# Patient Record
Sex: Male | Born: 1950 | Race: White | Hispanic: No | Marital: Married | Smoking: Never smoker
Health system: Southern US, Community
[De-identification: ages and names within clinical notes are randomized; demographics above are authoritative.]

## PROBLEM LIST (undated history)

## (undated) DIAGNOSIS — E039 Hypothyroidism, unspecified: Secondary | ICD-10-CM

## (undated) DIAGNOSIS — I10 Essential (primary) hypertension: Secondary | ICD-10-CM

## (undated) HISTORY — PX: FRACTURE SURGERY: SHX138

## (undated) HISTORY — PX: SPLENECTOMY: SUR1306

## (undated) HISTORY — PX: CHOLECYSTECTOMY: SHX55

---

## 2005-12-24 ENCOUNTER — Ambulatory Visit (HOSPITAL_COMMUNITY): Admission: RE | Admit: 2005-12-24 | Discharge: 2005-12-24 | Payer: Self-pay | Admitting: Thoracic Surgery

## 2007-03-24 ENCOUNTER — Ambulatory Visit (HOSPITAL_COMMUNITY): Admission: RE | Admit: 2007-03-24 | Discharge: 2007-03-24 | Payer: Self-pay | Admitting: Internal Medicine

## 2010-04-01 DIAGNOSIS — R55 Syncope and collapse: Secondary | ICD-10-CM

## 2010-04-03 DIAGNOSIS — R55 Syncope and collapse: Secondary | ICD-10-CM

## 2010-07-30 ENCOUNTER — Ambulatory Visit (INDEPENDENT_AMBULATORY_CARE_PROVIDER_SITE_OTHER): Payer: PRIVATE HEALTH INSURANCE | Admitting: Internal Medicine

## 2010-10-19 ENCOUNTER — Telehealth (INDEPENDENT_AMBULATORY_CARE_PROVIDER_SITE_OTHER): Payer: Self-pay | Admitting: *Deleted

## 2010-10-19 DIAGNOSIS — R634 Abnormal weight loss: Secondary | ICD-10-CM

## 2010-10-19 NOTE — Telephone Encounter (Signed)
Lads due

## 2019-01-20 DIAGNOSIS — S82142D Displaced bicondylar fracture of left tibia, subsequent encounter for closed fracture with routine healing: Secondary | ICD-10-CM | POA: Diagnosis not present

## 2019-01-20 DIAGNOSIS — S42022D Displaced fracture of shaft of left clavicle, subsequent encounter for fracture with routine healing: Secondary | ICD-10-CM | POA: Diagnosis not present

## 2019-01-20 DIAGNOSIS — R262 Difficulty in walking, not elsewhere classified: Secondary | ICD-10-CM | POA: Diagnosis not present

## 2019-01-20 DIAGNOSIS — M6281 Muscle weakness (generalized): Secondary | ICD-10-CM | POA: Diagnosis not present

## 2019-01-27 DIAGNOSIS — M6281 Muscle weakness (generalized): Secondary | ICD-10-CM | POA: Diagnosis not present

## 2019-01-27 DIAGNOSIS — S42022D Displaced fracture of shaft of left clavicle, subsequent encounter for fracture with routine healing: Secondary | ICD-10-CM | POA: Diagnosis not present

## 2019-01-27 DIAGNOSIS — S82142D Displaced bicondylar fracture of left tibia, subsequent encounter for closed fracture with routine healing: Secondary | ICD-10-CM | POA: Diagnosis not present

## 2019-01-27 DIAGNOSIS — R262 Difficulty in walking, not elsewhere classified: Secondary | ICD-10-CM | POA: Diagnosis not present

## 2019-02-03 DIAGNOSIS — M6281 Muscle weakness (generalized): Secondary | ICD-10-CM | POA: Diagnosis not present

## 2019-02-03 DIAGNOSIS — R262 Difficulty in walking, not elsewhere classified: Secondary | ICD-10-CM | POA: Diagnosis not present

## 2019-02-03 DIAGNOSIS — S82142D Displaced bicondylar fracture of left tibia, subsequent encounter for closed fracture with routine healing: Secondary | ICD-10-CM | POA: Diagnosis not present

## 2019-02-03 DIAGNOSIS — S42022D Displaced fracture of shaft of left clavicle, subsequent encounter for fracture with routine healing: Secondary | ICD-10-CM | POA: Diagnosis not present

## 2019-02-10 DIAGNOSIS — S82142D Displaced bicondylar fracture of left tibia, subsequent encounter for closed fracture with routine healing: Secondary | ICD-10-CM | POA: Diagnosis not present

## 2019-02-10 DIAGNOSIS — S42022D Displaced fracture of shaft of left clavicle, subsequent encounter for fracture with routine healing: Secondary | ICD-10-CM | POA: Diagnosis not present

## 2019-02-10 DIAGNOSIS — R262 Difficulty in walking, not elsewhere classified: Secondary | ICD-10-CM | POA: Diagnosis not present

## 2019-02-10 DIAGNOSIS — M6281 Muscle weakness (generalized): Secondary | ICD-10-CM | POA: Diagnosis not present

## 2019-02-12 DIAGNOSIS — I10 Essential (primary) hypertension: Secondary | ICD-10-CM | POA: Diagnosis not present

## 2019-02-12 DIAGNOSIS — R911 Solitary pulmonary nodule: Secondary | ICD-10-CM | POA: Diagnosis not present

## 2019-02-12 DIAGNOSIS — Z299 Encounter for prophylactic measures, unspecified: Secondary | ICD-10-CM | POA: Diagnosis not present

## 2019-02-12 DIAGNOSIS — M25562 Pain in left knee: Secondary | ICD-10-CM | POA: Diagnosis not present

## 2019-02-12 DIAGNOSIS — D692 Other nonthrombocytopenic purpura: Secondary | ICD-10-CM | POA: Diagnosis not present

## 2019-02-17 DIAGNOSIS — R262 Difficulty in walking, not elsewhere classified: Secondary | ICD-10-CM | POA: Diagnosis not present

## 2019-02-17 DIAGNOSIS — S42142D Displaced fracture of glenoid cavity of scapula, left shoulder, subsequent encounter for fracture with routine healing: Secondary | ICD-10-CM | POA: Diagnosis not present

## 2019-02-17 DIAGNOSIS — S42022D Displaced fracture of shaft of left clavicle, subsequent encounter for fracture with routine healing: Secondary | ICD-10-CM | POA: Diagnosis not present

## 2019-02-17 DIAGNOSIS — M6281 Muscle weakness (generalized): Secondary | ICD-10-CM | POA: Diagnosis not present

## 2019-03-24 DIAGNOSIS — R918 Other nonspecific abnormal finding of lung field: Secondary | ICD-10-CM | POA: Diagnosis not present

## 2019-03-24 DIAGNOSIS — R911 Solitary pulmonary nodule: Secondary | ICD-10-CM | POA: Diagnosis not present

## 2019-03-26 DIAGNOSIS — Z299 Encounter for prophylactic measures, unspecified: Secondary | ICD-10-CM | POA: Diagnosis not present

## 2019-03-26 DIAGNOSIS — D1800 Hemangioma unspecified site: Secondary | ICD-10-CM | POA: Diagnosis not present

## 2019-03-26 DIAGNOSIS — I700 Atherosclerosis of aorta: Secondary | ICD-10-CM | POA: Diagnosis not present

## 2019-03-26 DIAGNOSIS — R911 Solitary pulmonary nodule: Secondary | ICD-10-CM | POA: Diagnosis not present

## 2019-03-26 DIAGNOSIS — M545 Low back pain: Secondary | ICD-10-CM | POA: Diagnosis not present

## 2019-03-26 DIAGNOSIS — I10 Essential (primary) hypertension: Secondary | ICD-10-CM | POA: Diagnosis not present

## 2019-04-13 DIAGNOSIS — Z1211 Encounter for screening for malignant neoplasm of colon: Secondary | ICD-10-CM | POA: Diagnosis not present

## 2019-04-13 DIAGNOSIS — Z7189 Other specified counseling: Secondary | ICD-10-CM | POA: Diagnosis not present

## 2019-04-13 DIAGNOSIS — I10 Essential (primary) hypertension: Secondary | ICD-10-CM | POA: Diagnosis not present

## 2019-04-13 DIAGNOSIS — Z79899 Other long term (current) drug therapy: Secondary | ICD-10-CM | POA: Diagnosis not present

## 2019-04-13 DIAGNOSIS — R5383 Other fatigue: Secondary | ICD-10-CM | POA: Diagnosis not present

## 2019-04-13 DIAGNOSIS — Z0000 Encounter for general adult medical examination without abnormal findings: Secondary | ICD-10-CM | POA: Diagnosis not present

## 2019-04-13 DIAGNOSIS — E039 Hypothyroidism, unspecified: Secondary | ICD-10-CM | POA: Diagnosis not present

## 2019-04-13 DIAGNOSIS — E7800 Pure hypercholesterolemia, unspecified: Secondary | ICD-10-CM | POA: Diagnosis not present

## 2019-04-13 DIAGNOSIS — Z299 Encounter for prophylactic measures, unspecified: Secondary | ICD-10-CM | POA: Diagnosis not present

## 2019-05-22 DIAGNOSIS — M1990 Unspecified osteoarthritis, unspecified site: Secondary | ICD-10-CM | POA: Diagnosis not present

## 2019-05-22 DIAGNOSIS — Z20822 Contact with and (suspected) exposure to covid-19: Secondary | ICD-10-CM | POA: Diagnosis not present

## 2019-05-22 DIAGNOSIS — I10 Essential (primary) hypertension: Secondary | ICD-10-CM | POA: Diagnosis not present

## 2019-05-22 DIAGNOSIS — K219 Gastro-esophageal reflux disease without esophagitis: Secondary | ICD-10-CM | POA: Diagnosis not present

## 2019-05-22 DIAGNOSIS — Z888 Allergy status to other drugs, medicaments and biological substances status: Secondary | ICD-10-CM | POA: Diagnosis not present

## 2019-05-22 DIAGNOSIS — Z79899 Other long term (current) drug therapy: Secondary | ICD-10-CM | POA: Diagnosis not present

## 2019-05-22 DIAGNOSIS — R42 Dizziness and giddiness: Secondary | ICD-10-CM | POA: Diagnosis not present

## 2019-05-22 DIAGNOSIS — E039 Hypothyroidism, unspecified: Secondary | ICD-10-CM | POA: Diagnosis not present

## 2019-05-22 DIAGNOSIS — D72829 Elevated white blood cell count, unspecified: Secondary | ICD-10-CM | POA: Diagnosis not present

## 2019-05-24 DIAGNOSIS — Z299 Encounter for prophylactic measures, unspecified: Secondary | ICD-10-CM | POA: Diagnosis not present

## 2019-05-24 DIAGNOSIS — I10 Essential (primary) hypertension: Secondary | ICD-10-CM | POA: Diagnosis not present

## 2019-05-24 DIAGNOSIS — I700 Atherosclerosis of aorta: Secondary | ICD-10-CM | POA: Diagnosis not present

## 2019-05-31 DIAGNOSIS — I700 Atherosclerosis of aorta: Secondary | ICD-10-CM | POA: Diagnosis not present

## 2019-05-31 DIAGNOSIS — I10 Essential (primary) hypertension: Secondary | ICD-10-CM | POA: Diagnosis not present

## 2019-05-31 DIAGNOSIS — E039 Hypothyroidism, unspecified: Secondary | ICD-10-CM | POA: Diagnosis not present

## 2019-05-31 DIAGNOSIS — Z299 Encounter for prophylactic measures, unspecified: Secondary | ICD-10-CM | POA: Diagnosis not present

## 2019-06-30 DIAGNOSIS — I700 Atherosclerosis of aorta: Secondary | ICD-10-CM | POA: Diagnosis not present

## 2019-06-30 DIAGNOSIS — I10 Essential (primary) hypertension: Secondary | ICD-10-CM | POA: Diagnosis not present

## 2019-06-30 DIAGNOSIS — Z299 Encounter for prophylactic measures, unspecified: Secondary | ICD-10-CM | POA: Diagnosis not present

## 2019-06-30 DIAGNOSIS — E559 Vitamin D deficiency, unspecified: Secondary | ICD-10-CM | POA: Diagnosis not present

## 2019-06-30 DIAGNOSIS — R5383 Other fatigue: Secondary | ICD-10-CM | POA: Diagnosis not present

## 2019-07-01 DIAGNOSIS — R5383 Other fatigue: Secondary | ICD-10-CM | POA: Diagnosis not present

## 2019-07-01 DIAGNOSIS — E559 Vitamin D deficiency, unspecified: Secondary | ICD-10-CM | POA: Diagnosis not present

## 2019-07-30 ENCOUNTER — Inpatient Hospital Stay (HOSPITAL_COMMUNITY)
Admission: EM | Admit: 2019-07-30 | Discharge: 2019-08-06 | DRG: 184 | Disposition: A | Discharge status: Home Health | Payer: Medicare Other | Attending: General Surgery | Admitting: General Surgery

## 2019-07-30 ENCOUNTER — Emergency Department (HOSPITAL_COMMUNITY): Payer: Medicare Other

## 2019-07-30 DIAGNOSIS — E785 Hyperlipidemia, unspecified: Secondary | ICD-10-CM | POA: Diagnosis present

## 2019-07-30 DIAGNOSIS — D1803 Hemangioma of intra-abdominal structures: Secondary | ICD-10-CM | POA: Diagnosis present

## 2019-07-30 DIAGNOSIS — Z20822 Contact with and (suspected) exposure to covid-19: Secondary | ICD-10-CM | POA: Diagnosis present

## 2019-07-30 DIAGNOSIS — Z23 Encounter for immunization: Secondary | ICD-10-CM

## 2019-07-30 DIAGNOSIS — S00512A Abrasion of oral cavity, initial encounter: Secondary | ICD-10-CM | POA: Diagnosis present

## 2019-07-30 DIAGNOSIS — Z743 Need for continuous supervision: Secondary | ICD-10-CM | POA: Diagnosis not present

## 2019-07-30 DIAGNOSIS — S22069A Unspecified fracture of T7-T8 vertebra, initial encounter for closed fracture: Secondary | ICD-10-CM | POA: Diagnosis not present

## 2019-07-30 DIAGNOSIS — S60811A Abrasion of right wrist, initial encounter: Secondary | ICD-10-CM | POA: Diagnosis not present

## 2019-07-30 DIAGNOSIS — R21 Rash and other nonspecific skin eruption: Secondary | ICD-10-CM | POA: Diagnosis not present

## 2019-07-30 DIAGNOSIS — R52 Pain, unspecified: Secondary | ICD-10-CM

## 2019-07-30 DIAGNOSIS — S3991XA Unspecified injury of abdomen, initial encounter: Secondary | ICD-10-CM | POA: Diagnosis not present

## 2019-07-30 DIAGNOSIS — M4854XA Collapsed vertebra, not elsewhere classified, thoracic region, initial encounter for fracture: Secondary | ICD-10-CM | POA: Diagnosis present

## 2019-07-30 DIAGNOSIS — Z79891 Long term (current) use of opiate analgesic: Secondary | ICD-10-CM | POA: Diagnosis not present

## 2019-07-30 DIAGNOSIS — I2510 Atherosclerotic heart disease of native coronary artery without angina pectoris: Secondary | ICD-10-CM | POA: Diagnosis not present

## 2019-07-30 DIAGNOSIS — J9811 Atelectasis: Secondary | ICD-10-CM | POA: Diagnosis not present

## 2019-07-30 DIAGNOSIS — S2243XA Multiple fractures of ribs, bilateral, initial encounter for closed fracture: Principal | ICD-10-CM | POA: Diagnosis present

## 2019-07-30 DIAGNOSIS — S3993XA Unspecified injury of pelvis, initial encounter: Secondary | ICD-10-CM | POA: Diagnosis not present

## 2019-07-30 DIAGNOSIS — S0990XA Unspecified injury of head, initial encounter: Secondary | ICD-10-CM | POA: Diagnosis not present

## 2019-07-30 DIAGNOSIS — Z888 Allergy status to other drugs, medicaments and biological substances status: Secondary | ICD-10-CM | POA: Diagnosis not present

## 2019-07-30 DIAGNOSIS — R0602 Shortness of breath: Secondary | ICD-10-CM

## 2019-07-30 DIAGNOSIS — I119 Hypertensive heart disease without heart failure: Secondary | ICD-10-CM | POA: Diagnosis present

## 2019-07-30 DIAGNOSIS — M25519 Pain in unspecified shoulder: Secondary | ICD-10-CM | POA: Diagnosis not present

## 2019-07-30 DIAGNOSIS — R0989 Other specified symptoms and signs involving the circulatory and respiratory systems: Secondary | ICD-10-CM

## 2019-07-30 DIAGNOSIS — R404 Transient alteration of awareness: Secondary | ICD-10-CM | POA: Diagnosis not present

## 2019-07-30 DIAGNOSIS — Z79899 Other long term (current) drug therapy: Secondary | ICD-10-CM

## 2019-07-30 DIAGNOSIS — S199XXA Unspecified injury of neck, initial encounter: Secondary | ICD-10-CM | POA: Diagnosis not present

## 2019-07-30 DIAGNOSIS — S52131A Displaced fracture of neck of right radius, initial encounter for closed fracture: Secondary | ICD-10-CM | POA: Diagnosis present

## 2019-07-30 DIAGNOSIS — Z9049 Acquired absence of other specified parts of digestive tract: Secondary | ICD-10-CM

## 2019-07-30 DIAGNOSIS — I517 Cardiomegaly: Secondary | ICD-10-CM | POA: Diagnosis not present

## 2019-07-30 DIAGNOSIS — Z9081 Acquired absence of spleen: Secondary | ICD-10-CM

## 2019-07-30 DIAGNOSIS — R0902 Hypoxemia: Secondary | ICD-10-CM | POA: Diagnosis not present

## 2019-07-30 DIAGNOSIS — T07XXXA Unspecified multiple injuries, initial encounter: Secondary | ICD-10-CM | POA: Diagnosis not present

## 2019-07-30 DIAGNOSIS — S2239XA Fracture of one rib, unspecified side, initial encounter for closed fracture: Secondary | ICD-10-CM

## 2019-07-30 DIAGNOSIS — S2249XA Multiple fractures of ribs, unspecified side, initial encounter for closed fracture: Secondary | ICD-10-CM

## 2019-07-30 DIAGNOSIS — S27321A Contusion of lung, unilateral, initial encounter: Secondary | ICD-10-CM | POA: Diagnosis present

## 2019-07-30 DIAGNOSIS — T1490XA Injury, unspecified, initial encounter: Secondary | ICD-10-CM

## 2019-07-30 DIAGNOSIS — E039 Hypothyroidism, unspecified: Secondary | ICD-10-CM | POA: Diagnosis not present

## 2019-07-30 DIAGNOSIS — Z79890 Hormone replacement therapy (postmenopausal): Secondary | ICD-10-CM

## 2019-07-30 DIAGNOSIS — S2242XA Multiple fractures of ribs, left side, initial encounter for closed fracture: Secondary | ICD-10-CM | POA: Diagnosis not present

## 2019-07-30 HISTORY — DX: Hypothyroidism, unspecified: E03.9

## 2019-07-30 HISTORY — DX: Essential (primary) hypertension: I10

## 2019-07-30 LAB — COMPREHENSIVE METABOLIC PANEL
ALT: 78 U/L — ABNORMAL HIGH (ref 0–44)
AST: 118 U/L — ABNORMAL HIGH (ref 15–41)
Albumin: 3.7 g/dL (ref 3.5–5.0)
Alkaline Phosphatase: 67 U/L (ref 38–126)
Anion gap: 14 (ref 5–15)
BUN: 13 mg/dL (ref 8–23)
CO2: 22 mmol/L (ref 22–32)
Calcium: 9 mg/dL (ref 8.9–10.3)
Chloride: 102 mmol/L (ref 98–111)
Creatinine, Ser: 1.05 mg/dL (ref 0.61–1.24)
GFR calc Af Amer: >60 mL/min (ref >60)
GFR calc non Af Amer: >60 mL/min (ref >60)
Glucose, Bld: 120 mg/dL — ABNORMAL HIGH (ref 70–99)
Potassium: 3.4 mmol/L — ABNORMAL LOW (ref 3.5–5.1)
Sodium: 138 mmol/L (ref 135–145)
Total Bilirubin: 0.5 mg/dL (ref 0.3–1.2)
Total Protein: 6.2 g/dL — ABNORMAL LOW (ref 6.5–8.1)

## 2019-07-30 LAB — I-STAT CHEM 8, ED
BUN: 15 mg/dL (ref 8–23)
Calcium, Ion: 1.18 mmol/L (ref 1.15–1.40)
Chloride: 101 mmol/L (ref 98–111)
Creatinine, Ser: 1.4 mg/dL — ABNORMAL HIGH (ref 0.61–1.24)
Glucose, Bld: 117 mg/dL — ABNORMAL HIGH (ref 70–99)
HCT: 42 % (ref 39.0–52.0)
Hemoglobin: 14.3 g/dL (ref 13.0–17.0)
Potassium: 3.4 mmol/L — ABNORMAL LOW (ref 3.5–5.1)
Sodium: 140 mmol/L (ref 135–145)
TCO2: 29 mmol/L (ref 22–32)

## 2019-07-30 LAB — LACTIC ACID, PLASMA: Lactic Acid, Venous: 2.8 mmol/L (ref 0.5–1.9)

## 2019-07-30 LAB — CBC
HCT: 40.9 % (ref 39.0–52.0)
Hemoglobin: 14.1 g/dL (ref 13.0–17.0)
MCH: 35.2 pg — ABNORMAL HIGH (ref 26.0–34.0)
MCHC: 34.5 g/dL (ref 30.0–36.0)
MCV: 102 fL — ABNORMAL HIGH (ref 80.0–100.0)
Platelets: 335 10*3/uL (ref 150–400)
RBC: 4.01 MIL/uL — ABNORMAL LOW (ref 4.22–5.81)
RDW: 15 % (ref 11.5–15.5)
WBC: 18 10*3/uL — ABNORMAL HIGH (ref 4.0–10.5)
nRBC: 0 % (ref 0.0–0.2)

## 2019-07-30 LAB — PROTIME-INR
INR: 1 (ref 0.8–1.2)
Prothrombin Time: 12.9 seconds (ref 11.4–15.2)

## 2019-07-30 LAB — CBG MONITORING, ED: Glucose-Capillary: 106 mg/dL — ABNORMAL HIGH (ref 70–99)

## 2019-07-30 LAB — TROPONIN I (HIGH SENSITIVITY): Troponin I (High Sensitivity): 10 ng/L (ref <18)

## 2019-07-30 LAB — ETHANOL: Alcohol, Ethyl (B): 154 mg/dL — ABNORMAL HIGH (ref <10)

## 2019-07-30 LAB — SAMPLE TO BLOOD BANK

## 2019-07-30 MED ORDER — SODIUM CHLORIDE 0.9 % IV BOLUS
1000.0000 mL | Freq: Once | INTRAVENOUS | Status: AC
  Administered 2019-07-30: 1000 mL via INTRAVENOUS

## 2019-07-30 MED ORDER — SODIUM CHLORIDE 0.9 % IV SOLN: INTRAVENOUS | Status: DC

## 2019-07-30 MED ORDER — TETANUS-DIPHTH-ACELL PERTUSSIS 5-2.5-18.5 LF-MCG/0.5 IM SUSP
0.5000 mL | Freq: Once | INTRAMUSCULAR | Status: AC
  Administered 2019-07-30: 0.5 mL via INTRAMUSCULAR

## 2019-07-30 MED ORDER — FENTANYL CITRATE (PF) 100 MCG/2ML IJ SOLN
50.0000 ug | Freq: Once | INTRAMUSCULAR | Status: AC
  Administered 2019-07-30: 50 ug via INTRAVENOUS
  Filled 2019-07-30: qty 2

## 2019-07-30 MED ORDER — FENTANYL CITRATE (PF) 100 MCG/2ML IJ SOLN
50.0000 ug | Freq: Once | INTRAMUSCULAR | Status: AC
  Administered 2019-07-30: 50 ug via INTRAVENOUS

## 2019-07-30 MED ORDER — ONDANSETRON HCL 4 MG/2ML IJ SOLN
INTRAMUSCULAR | Status: AC
  Filled 2019-07-30: qty 2

## 2019-07-30 MED ORDER — ONDANSETRON HCL 4 MG/2ML IJ SOLN
4.0000 mg | Freq: Once | INTRAMUSCULAR | Status: AC
  Administered 2019-07-30: 4 mg via INTRAVENOUS

## 2019-07-30 MED ORDER — IOHEXOL 300 MG/ML  SOLN
100.0000 mL | Freq: Once | INTRAMUSCULAR | Status: AC | PRN
  Administered 2019-07-30: 100 mL via INTRAVENOUS

## 2019-07-30 MED ORDER — FENTANYL CITRATE (PF) 100 MCG/2ML IJ SOLN
INTRAMUSCULAR | Status: AC
  Filled 2019-07-30: qty 2

## 2019-07-30 NOTE — Progress Notes (Signed)
Orthopedic Tech Progress Note Patient Details:  John Santos August 23, 1950 [id number] Level 2 Trauma  Patient ID: [name], male   DOB: 04/06/1950, 69 y.o.   MRN: [id number]   [name] 07/30/2019, 9:23 PM

## 2019-07-30 NOTE — ED Notes (Signed)
[facility] at bedside
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
## 2019-07-30 NOTE — ED Notes (Signed)
In CT with patient

## 2019-07-30 NOTE — ED Notes (Signed)
CT will be ready in 4 minutes

## 2019-07-31 ENCOUNTER — Encounter (HOSPITAL_COMMUNITY): Payer: Self-pay

## 2019-07-31 DIAGNOSIS — R918 Other nonspecific abnormal finding of lung field: Secondary | ICD-10-CM | POA: Diagnosis not present

## 2019-07-31 DIAGNOSIS — J90 Pleural effusion, not elsewhere classified: Secondary | ICD-10-CM | POA: Diagnosis not present

## 2019-07-31 DIAGNOSIS — R21 Rash and other nonspecific skin eruption: Secondary | ICD-10-CM | POA: Diagnosis not present

## 2019-07-31 DIAGNOSIS — Z9049 Acquired absence of other specified parts of digestive tract: Secondary | ICD-10-CM | POA: Diagnosis not present

## 2019-07-31 DIAGNOSIS — I517 Cardiomegaly: Secondary | ICD-10-CM | POA: Diagnosis not present

## 2019-07-31 DIAGNOSIS — S52131A Displaced fracture of neck of right radius, initial encounter for closed fracture: Secondary | ICD-10-CM | POA: Diagnosis not present

## 2019-07-31 DIAGNOSIS — Z888 Allergy status to other drugs, medicaments and biological substances status: Secondary | ICD-10-CM | POA: Diagnosis not present

## 2019-07-31 DIAGNOSIS — Z20822 Contact with and (suspected) exposure to covid-19: Secondary | ICD-10-CM | POA: Diagnosis present

## 2019-07-31 DIAGNOSIS — I119 Hypertensive heart disease without heart failure: Secondary | ICD-10-CM | POA: Diagnosis present

## 2019-07-31 DIAGNOSIS — I2510 Atherosclerotic heart disease of native coronary artery without angina pectoris: Secondary | ICD-10-CM | POA: Diagnosis present

## 2019-07-31 DIAGNOSIS — S2249XA Multiple fractures of ribs, unspecified side, initial encounter for closed fracture: Secondary | ICD-10-CM | POA: Diagnosis not present

## 2019-07-31 DIAGNOSIS — S22068A Other fracture of T7-T8 thoracic vertebra, initial encounter for closed fracture: Secondary | ICD-10-CM | POA: Diagnosis not present

## 2019-07-31 DIAGNOSIS — J942 Hemothorax: Secondary | ICD-10-CM | POA: Diagnosis not present

## 2019-07-31 DIAGNOSIS — S2242XA Multiple fractures of ribs, left side, initial encounter for closed fracture: Secondary | ICD-10-CM | POA: Diagnosis not present

## 2019-07-31 DIAGNOSIS — R531 Weakness: Secondary | ICD-10-CM | POA: Diagnosis not present

## 2019-07-31 DIAGNOSIS — R0602 Shortness of breath: Secondary | ICD-10-CM | POA: Diagnosis not present

## 2019-07-31 DIAGNOSIS — S22069A Unspecified fracture of T7-T8 vertebra, initial encounter for closed fracture: Secondary | ICD-10-CM | POA: Diagnosis not present

## 2019-07-31 DIAGNOSIS — E785 Hyperlipidemia, unspecified: Secondary | ICD-10-CM | POA: Diagnosis present

## 2019-07-31 DIAGNOSIS — S00512A Abrasion of oral cavity, initial encounter: Secondary | ICD-10-CM | POA: Diagnosis present

## 2019-07-31 DIAGNOSIS — M4854XA Collapsed vertebra, not elsewhere classified, thoracic region, initial encounter for fracture: Secondary | ICD-10-CM | POA: Diagnosis present

## 2019-07-31 DIAGNOSIS — S52134A Nondisplaced fracture of neck of right radius, initial encounter for closed fracture: Secondary | ICD-10-CM | POA: Diagnosis not present

## 2019-07-31 DIAGNOSIS — E039 Hypothyroidism, unspecified: Secondary | ICD-10-CM | POA: Diagnosis present

## 2019-07-31 DIAGNOSIS — S22040A Wedge compression fracture of fourth thoracic vertebra, initial encounter for closed fracture: Secondary | ICD-10-CM | POA: Diagnosis not present

## 2019-07-31 DIAGNOSIS — S22070A Wedge compression fracture of T9-T10 vertebra, initial encounter for closed fracture: Secondary | ICD-10-CM | POA: Diagnosis not present

## 2019-07-31 DIAGNOSIS — M25521 Pain in right elbow: Secondary | ICD-10-CM | POA: Diagnosis not present

## 2019-07-31 DIAGNOSIS — J9811 Atelectasis: Secondary | ICD-10-CM | POA: Diagnosis not present

## 2019-07-31 DIAGNOSIS — Z9081 Acquired absence of spleen: Secondary | ICD-10-CM | POA: Diagnosis not present

## 2019-07-31 DIAGNOSIS — Z79890 Hormone replacement therapy (postmenopausal): Secondary | ICD-10-CM | POA: Diagnosis not present

## 2019-07-31 DIAGNOSIS — S2243XA Multiple fractures of ribs, bilateral, initial encounter for closed fracture: Secondary | ICD-10-CM | POA: Diagnosis present

## 2019-07-31 DIAGNOSIS — S27321A Contusion of lung, unilateral, initial encounter: Secondary | ICD-10-CM | POA: Diagnosis not present

## 2019-07-31 DIAGNOSIS — Z79899 Other long term (current) drug therapy: Secondary | ICD-10-CM | POA: Diagnosis not present

## 2019-07-31 DIAGNOSIS — D1803 Hemangioma of intra-abdominal structures: Secondary | ICD-10-CM | POA: Diagnosis present

## 2019-07-31 DIAGNOSIS — R0989 Other specified symptoms and signs involving the circulatory and respiratory systems: Secondary | ICD-10-CM | POA: Diagnosis not present

## 2019-07-31 DIAGNOSIS — S60811A Abrasion of right wrist, initial encounter: Secondary | ICD-10-CM | POA: Diagnosis present

## 2019-07-31 DIAGNOSIS — R0902 Hypoxemia: Secondary | ICD-10-CM | POA: Diagnosis not present

## 2019-07-31 DIAGNOSIS — Z23 Encounter for immunization: Secondary | ICD-10-CM | POA: Diagnosis not present

## 2019-07-31 DIAGNOSIS — Z79891 Long term (current) use of opiate analgesic: Secondary | ICD-10-CM | POA: Diagnosis not present

## 2019-07-31 LAB — URINALYSIS, ROUTINE W REFLEX MICROSCOPIC
Bacteria, UA: NONE SEEN
Bilirubin Urine: NEGATIVE
Glucose, UA: NEGATIVE mg/dL
Ketones, ur: NEGATIVE mg/dL
Leukocytes,Ua: NEGATIVE
Nitrite: NEGATIVE
Protein, ur: NEGATIVE mg/dL
Specific Gravity, Urine: 1.023 (ref 1.005–1.030)
pH: 6 (ref 5.0–8.0)

## 2019-07-31 LAB — COMPREHENSIVE METABOLIC PANEL
ALT: 122 U/L — ABNORMAL HIGH (ref 0–44)
AST: 211 U/L — ABNORMAL HIGH (ref 15–41)
Albumin: 3.4 g/dL — ABNORMAL LOW (ref 3.5–5.0)
Alkaline Phosphatase: 65 U/L (ref 38–126)
Anion gap: 12 (ref 5–15)
BUN: 11 mg/dL (ref 8–23)
CO2: 20 mmol/L — ABNORMAL LOW (ref 22–32)
Calcium: 8.2 mg/dL — ABNORMAL LOW (ref 8.9–10.3)
Chloride: 103 mmol/L (ref 98–111)
Creatinine, Ser: 0.87 mg/dL (ref 0.61–1.24)
GFR calc Af Amer: >60 mL/min (ref >60)
GFR calc non Af Amer: >60 mL/min (ref >60)
Glucose, Bld: 103 mg/dL — ABNORMAL HIGH (ref 70–99)
Potassium: 5.4 mmol/L — ABNORMAL HIGH (ref 3.5–5.1)
Sodium: 135 mmol/L (ref 135–145)
Total Bilirubin: 1.8 mg/dL — ABNORMAL HIGH (ref 0.3–1.2)
Total Protein: 5.8 g/dL — ABNORMAL LOW (ref 6.5–8.1)

## 2019-07-31 LAB — CBC
HCT: 40.5 % (ref 39.0–52.0)
Hemoglobin: 13 g/dL (ref 13.0–17.0)
MCH: 34.5 pg — ABNORMAL HIGH (ref 26.0–34.0)
MCHC: 32.1 g/dL (ref 30.0–36.0)
MCV: 107.4 fL — ABNORMAL HIGH (ref 80.0–100.0)
Platelets: 241 10*3/uL (ref 150–400)
RBC: 3.77 MIL/uL — ABNORMAL LOW (ref 4.22–5.81)
RDW: 15.8 % — ABNORMAL HIGH (ref 11.5–15.5)
WBC: 17.1 10*3/uL — ABNORMAL HIGH (ref 4.0–10.5)
nRBC: 0 % (ref 0.0–0.2)

## 2019-07-31 LAB — TROPONIN I (HIGH SENSITIVITY): Troponin I (High Sensitivity): 15 ng/L (ref <18)

## 2019-07-31 LAB — SARS CORONAVIRUS 2 BY RT PCR (HOSPITAL ORDER, PERFORMED IN ~~LOC~~ HOSPITAL LAB): SARS Coronavirus 2: NEGATIVE

## 2019-07-31 LAB — POTASSIUM: Potassium: 3.9 mmol/L (ref 3.5–5.1)

## 2019-07-31 MED ORDER — METHOCARBAMOL 500 MG PO TABS
500.0000 mg | ORAL_TABLET | Freq: Three times a day (TID) | ORAL | Status: DC
  Administered 2019-07-31 – 2019-08-02 (×7): 500 mg via ORAL
  Filled 2019-07-31 (×7): qty 1

## 2019-07-31 MED ORDER — OXYCODONE HCL 5 MG PO TABS
5.0000 mg | ORAL_TABLET | ORAL | Status: DC | PRN
  Administered 2019-08-06: 5 mg via ORAL
  Filled 2019-07-31 (×3): qty 1

## 2019-07-31 MED ORDER — POTASSIUM CHLORIDE IN NACL 20-0.9 MEQ/L-% IV SOLN
INTRAVENOUS | Status: DC
  Filled 2019-07-31 (×5): qty 1000

## 2019-07-31 MED ORDER — FOLIC ACID 1 MG PO TABS
1.0000 mg | ORAL_TABLET | Freq: Every day | ORAL | Status: DC
  Administered 2019-07-31 – 2019-08-05 (×6): 1 mg via ORAL
  Filled 2019-07-31 (×7): qty 1

## 2019-07-31 MED ORDER — PANTOPRAZOLE SODIUM 40 MG PO TBEC
40.0000 mg | DELAYED_RELEASE_TABLET | Freq: Every day | ORAL | Status: DC
  Administered 2019-07-31 – 2019-08-05 (×5): 40 mg via ORAL
  Filled 2019-07-31 (×7): qty 1

## 2019-07-31 MED ORDER — ADULT MULTIVITAMIN W/MINERALS CH
1.0000 | ORAL_TABLET | Freq: Every day | ORAL | Status: DC
  Administered 2019-07-31 – 2019-08-05 (×6): 1 via ORAL
  Filled 2019-07-31 (×7): qty 1

## 2019-07-31 MED ORDER — LORAZEPAM 1 MG PO TABS: 1.0000 mg | ORAL_TABLET | ORAL | Status: DC | PRN

## 2019-07-31 MED ORDER — HYDROCHLOROTHIAZIDE 25 MG PO TABS
25.0000 mg | ORAL_TABLET | Freq: Every day | ORAL | Status: DC
  Administered 2019-07-31 – 2019-08-05 (×6): 25 mg via ORAL
  Filled 2019-07-31 (×6): qty 1

## 2019-07-31 MED ORDER — OXYCODONE HCL 5 MG PO TABS
10.0000 mg | ORAL_TABLET | ORAL | Status: DC | PRN
  Administered 2019-07-31 – 2019-08-04 (×15): 10 mg via ORAL
  Filled 2019-07-31 (×15): qty 2

## 2019-07-31 MED ORDER — DOCUSATE SODIUM 100 MG PO CAPS
100.0000 mg | ORAL_CAPSULE | Freq: Two times a day (BID) | ORAL | Status: DC
  Administered 2019-07-31 – 2019-08-05 (×12): 100 mg via ORAL
  Filled 2019-07-31 (×13): qty 1

## 2019-07-31 MED ORDER — METOPROLOL SUCCINATE ER 25 MG PO TB24
25.0000 mg | ORAL_TABLET | Freq: Every evening | ORAL | Status: DC
  Administered 2019-07-31 – 2019-08-05 (×6): 25 mg via ORAL
  Filled 2019-07-31 (×6): qty 1

## 2019-07-31 MED ORDER — ONDANSETRON HCL 4 MG/2ML IJ SOLN
4.0000 mg | Freq: Four times a day (QID) | INTRAMUSCULAR | Status: DC | PRN
  Administered 2019-07-31: 4 mg via INTRAVENOUS
  Filled 2019-07-31: qty 2

## 2019-07-31 MED ORDER — ENOXAPARIN SODIUM 30 MG/0.3ML ~~LOC~~ SOLN
30.0000 mg | Freq: Two times a day (BID) | SUBCUTANEOUS | Status: DC
  Administered 2019-07-31 – 2019-08-05 (×6): 30 mg via SUBCUTANEOUS
  Filled 2019-07-31 (×11): qty 0.3

## 2019-07-31 MED ORDER — LEVOTHYROXINE SODIUM 100 MCG PO TABS
100.0000 ug | ORAL_TABLET | Freq: Every day | ORAL | Status: DC
  Administered 2019-07-31 – 2019-08-06 (×7): 100 ug via ORAL
  Filled 2019-07-31 (×7): qty 1

## 2019-07-31 MED ORDER — THIAMINE HCL 100 MG/ML IJ SOLN
100.0000 mg | Freq: Every day | INTRAMUSCULAR | Status: DC
  Administered 2019-08-01: 100 mg via INTRAVENOUS
  Filled 2019-07-31 (×2): qty 2

## 2019-07-31 MED ORDER — LORAZEPAM 2 MG/ML IJ SOLN: 1.0000 mg | INTRAMUSCULAR | Status: DC | PRN

## 2019-07-31 MED ORDER — ACETAMINOPHEN 325 MG PO TABS
650.0000 mg | ORAL_TABLET | Freq: Three times a day (TID) | ORAL | Status: DC
  Administered 2019-07-31 – 2019-08-06 (×19): 650 mg via ORAL
  Filled 2019-07-31 (×21): qty 2

## 2019-07-31 MED ORDER — MORPHINE SULFATE (PF) 2 MG/ML IV SOLN
1.0000 mg | INTRAVENOUS | Status: DC | PRN
  Administered 2019-07-31 – 2019-08-05 (×10): 2 mg via INTRAVENOUS
  Filled 2019-07-31 (×10): qty 1

## 2019-07-31 MED ORDER — BISACODYL 10 MG RE SUPP: 10.0000 mg | Freq: Every day | RECTAL | Status: DC | PRN

## 2019-07-31 MED ORDER — LISINOPRIL-HYDROCHLOROTHIAZIDE 20-25 MG PO TABS: 1.0000 | ORAL_TABLET | Freq: Every evening | ORAL | Status: DC

## 2019-07-31 MED ORDER — ONDANSETRON 4 MG PO TBDP: 4.0000 mg | ORAL_TABLET | Freq: Four times a day (QID) | ORAL | Status: DC | PRN

## 2019-07-31 MED ORDER — PANTOPRAZOLE SODIUM 40 MG IV SOLR
40.0000 mg | Freq: Every day | INTRAVENOUS | Status: DC
  Administered 2019-08-01: 40 mg via INTRAVENOUS
  Filled 2019-07-31: qty 40

## 2019-07-31 MED ORDER — CITALOPRAM HYDROBROMIDE 20 MG PO TABS
10.0000 mg | ORAL_TABLET | Freq: Every evening | ORAL | Status: DC
  Administered 2019-07-31 – 2019-08-05 (×6): 10 mg via ORAL
  Filled 2019-07-31 (×6): qty 1

## 2019-07-31 MED ORDER — THIAMINE HCL 100 MG PO TABS
100.0000 mg | ORAL_TABLET | Freq: Every day | ORAL | Status: DC
  Administered 2019-07-31 – 2019-08-05 (×5): 100 mg via ORAL
  Filled 2019-07-31 (×6): qty 1

## 2019-07-31 MED ORDER — LISINOPRIL 20 MG PO TABS
20.0000 mg | ORAL_TABLET | Freq: Every day | ORAL | Status: DC
  Administered 2019-07-31 – 2019-08-05 (×6): 20 mg via ORAL
  Filled 2019-07-31 (×6): qty 1
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
## 2019-07-31 NOTE — Progress Notes (Signed)
Orthopedic Tech Progress Note Patient Details:  John Santos [birth year] [id number] TLSO Brace has been ordered  Patient ID: John Santos, male   DOB: 1950-05-08, 69 y.o.   MRN: [id number]   [name] 07/31/2019, 12:20 AM

## 2019-07-31 NOTE — ED Notes (Signed)
Rep at bedside for TLSO brace placement

## 2019-07-31 NOTE — ED Notes (Signed)
Dr Wilson at bedside.
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
## 2019-07-31 NOTE — Progress Notes (Signed)
Orthopedic Tech Progress Note Patient Details:  John Santos 04-02-1950 [id number] Called in outside vendor brace Patient ID: John Santos, male   DOB: March 18, 1950, 69 y.o.   MRN: [id number]   [name] 07/31/2019, 9:53 AM

## 2019-07-31 NOTE — ED Notes (Signed)
Report called to 6N

## 2019-07-31 NOTE — ED Notes (Signed)
Ccollar removed by Dr. Redmond Pulling

## 2019-08-01 ENCOUNTER — Inpatient Hospital Stay (HOSPITAL_COMMUNITY): Payer: Medicare Other
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
## 2019-08-01 NOTE — Progress Notes (Signed)
Patient ID: John Santos, male   DOB: Jun 30, 1950, 69 y.o.   MRN: [id number] He looks pretty good.  He really has no significant back pain.  He is in his TLSO brace.  He complains of right medial elbow pain and left lateral rib pain.  He moves his legs well.  Mobilize as tolerated.  Follow-up with me in the office 2 weeks after discharge for repeat x-rays

## 2019-08-02 ENCOUNTER — Inpatient Hospital Stay (HOSPITAL_COMMUNITY): Payer: Medicare Other

## 2019-08-02 ENCOUNTER — Encounter (HOSPITAL_COMMUNITY): Payer: Self-pay

## 2019-08-02 ENCOUNTER — Other Ambulatory Visit: Payer: Self-pay

## 2019-08-02 MED ORDER — BACITRACIN ZINC 500 UNIT/GM EX OINT
1.0000 "application " | TOPICAL_OINTMENT | Freq: Two times a day (BID) | CUTANEOUS | Status: DC
  Administered 2019-08-02 – 2019-08-05 (×8): 1 via TOPICAL
  Filled 2019-08-02 (×2): qty 28.35

## 2019-08-02 MED ORDER — METHOCARBAMOL 500 MG PO TABS
1000.0000 mg | ORAL_TABLET | Freq: Three times a day (TID) | ORAL | Status: DC
  Administered 2019-08-02 – 2019-08-06 (×12): 1000 mg via ORAL
  Filled 2019-08-02 (×12): qty 2

## 2019-08-02 MED ORDER — MUPIROCIN 2 % EX OINT
TOPICAL_OINTMENT | CUTANEOUS | Status: AC
  Filled 2019-08-02: qty 22

## 2019-08-02 MED ORDER — IPRATROPIUM-ALBUTEROL 0.5-2.5 (3) MG/3ML IN SOLN
3.0000 mL | RESPIRATORY_TRACT | Status: DC | PRN
  Administered 2019-08-02 – 2019-08-03 (×5): 3 mL via RESPIRATORY_TRACT
  Filled 2019-08-02 (×5): qty 3

## 2019-08-02 MED ORDER — POLYETHYLENE GLYCOL 3350 17 G PO PACK
17.0000 g | PACK | Freq: Every day | ORAL | Status: DC | PRN
  Administered 2019-08-02: 17 g via ORAL
  Filled 2019-08-02: qty 1

## 2019-08-02 MED ORDER — GUAIFENESIN-DM 100-10 MG/5ML PO SYRP
5.0000 mL | ORAL_SOLUTION | ORAL | Status: DC | PRN
  Administered 2019-08-02 – 2019-08-03 (×3): 5 mL via ORAL
  Filled 2019-08-02 (×3): qty 5
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
## 2019-08-02 NOTE — Progress Notes (Signed)
Orthopedic Tech Progress Note Patient Details:  John Santos [birth year] [id number]  Ortho Devices Type of Ortho Device: Arm sling, Long arm splint Ortho Device/Splint Location: RUE Ortho Device/Splint Interventions: Ordered, Application   Post Interventions Patient Tolerated: Fair, Well Instructions Provided: Care of device, Adjustment of device   [name] 08/02/2019, 2:30 PM

## 2019-08-02 NOTE — Discharge Instructions (Signed)
Intensive Outpatient Programs  [facility]    [facility] [address]     [place] [place] [address] [place],  [place], [place]      [place]  (Inpatient and outpatient)  [phone] (Suboxone and Methadone) [address]           [phone]           [facility]    [facility] - Intensive Outpatient [address]     [address] [place] [address] [place], [place] [address]     [place], [place]      [phone]  [facility] (Outpatient, Inpatient, [place]  [facility] (Groups and [facility]) (insurance only) [phone]    [place], [place]       [facility]    [facility] (for caregivers and family) [address]     [address] [place], [place], [place]      [phone]  Residential Treatment Programs  [place]  [place](2 years) Residential: 56 days)  [facility] ([place], [place], [place] [phone]      [phone] or [phone]  [facility] [place]    [facility] [address]      [place], [place], [place]      [phone]  [place]   [facility] ([facility]) [place]     [address] [place],  [address]     [place], [place]      [phone] Admissions: 8am-3pm M-F  [facility]: Residential Program [phone] Days)              [facility]: [facility]  [place], [place], [place] or [phone]    (Walk in Hours over the weekend or by referral)   Mobile Crisis: [facility]:[phone] (for crisis  response 24 hours a day)   How to Use a Back Brace  A back brace is a form-fitting device that wraps around your trunk to support your lower back, abdomen, and hips. You may need to wear a back brace to relieve back pain or to correct a medical condition related to the back, such as abnormal curvature of the spine (scoliosis). A back brace can maintain or correct the shape of the spine and prevent a spinal problem from getting worse. A back brace can also take pressure off the layers of tissue (disks) between the bones of the spine (vertebrae). You may need a back brace to keep your back and spine in place while you heal from an injury or recover from surgery. Back braces can be either plastic (rigid brace) or soft elastic (dynamic brace).  A rigid brace usually covers both the front and back of the entire upper body.  A soft brace may cover only the lower back and abdomen and may fasten with self-adhesive elastic straps. Your health care provider will recommend  the proper brace for your needs and medical condition. What are the risks?  A back brace may not help if you do not wear it as directed by your health care provider. Be sure to wear the brace exactly as instructed in order to prevent further back problems.  Wearing the brace may be uncomfortable at first. You may have trouble sleeping with it on. It may also be hard for you to do certain activities while wearing it.  If the back brace is not worn as instructed, it may cause rubbing and pressure on your skin and may cause sores. How to use a back brace Different types of braces will have different instructions for use. Follow instructions from your health care provider about:  How to put on the brace.  When and how often to wear the brace. In some cases, braces may need to be worn for long stretches of time. For example, a brace may need to be worn for 16-23 hours a day when used for scoliosis.  How to take off the brace.  Any  safety tips you should follow when wearing the brace. This may include: ? Move carefully while wearing the brace. The brace restricts your movement and could lead to additional injuries. ? Use a cane or walker for support if you feel unsteady. ? Sit in high, firm chairs. It may be difficult to stand up from low, soft chairs. ? Check the skin around the brace every day. Tell your health care provider about any concerns. How to care for a back brace  Do not let the back brace get wet. Typically, you will remove the brace for bathing and then put it back on afterward.  If you have a rigid brace, be sure to store it in a safe place when you are not wearing it. This will help to prevent damage.  Clean or wash the back brace with mild soap and water as told by your health care provider. Contact a health care provider if:  Your brace gets damaged.  You have pain or discomfort when wearing the back brace.  Your back pain is getting worse or is not improving over time.  You notice redness or skin breakdown from wearing the brace. Summary  You may need to wear a back brace to relieve back pain or to correct a medical condition related to the back, such as abnormal curvature of the spine (scoliosis).  Follow instructions as told by your health care provider about how to use the brace and how to take care of it.  A back brace may not help if you do not wear it as directed by your health care provider. Wear the brace exactly as instructed in order to prevent further back problems.  Move carefully while wearing the brace.  Contact a health care provider if you have pain or discomfort when wearing the back brace or your back pain is getting worse or is not improving over time. This information is not intended to replace advice given to you by your health care provider. Make sure you discuss any questions you have with your health care provider. Document Revised: 11/26/2017 Document Reviewed:  11/26/2017 Elsevier Patient Education  [place].   Rib Fracture  A rib fracture is a break or crack in one of the bones of the ribs. The ribs are like a cage that goes around your upper chest. A broken or cracked rib is often painful, but most do not cause other  problems. Most rib fractures usually heal on their own in 1-3 months. Follow these instructions at home: Managing pain, stiffness, and swelling  If directed, apply ice to the injured area. ? Put ice in a plastic bag. ? Place a towel between your skin and the bag. ? Leave the ice on for 20 minutes, 2-3 times a day.  Take over-the-counter and prescription medicines only as told by your doctor. Activity  Avoid activities that cause pain to the injured area. Protect your injured area.  Slowly increase activity as told by your doctor. General instructions  Do deep breathing as told by your doctor. You may be told to: ? Take deep breaths many times a day. ? Cough many times a day while hugging a pillow. ? Use a device (incentive spirometer) to do deep breathing many times a day.  Drink enough fluid to keep your pee (urine) clear or pale yellow.  Do not wear a rib belt or binder. These do not allow you to breathe deeply.  Keep all follow-up visits as told by your doctor. This is important. Contact a doctor if:  You have a fever. Get help right away if:  You have trouble breathing.  You are short of breath.  You cannot stop coughing.  You cough up thick or bloody spit (sputum).  You feel sick to your stomach (nauseous), throw up (vomit), or have belly (abdominal) pain.  Your pain gets worse and medicine does not help. Summary  A rib fracture is a break or crack in one of the bones of the ribs.  Apply ice to the injured area and take medicines for pain as told by your doctor.  Take deep breaths and cough many times a day. Hug a pillow every time you cough. This information is not intended to replace  advice given to you by your health care provider. Make sure you discuss any questions you have with your health care provider. Document Revised: 12/13/2016 Document Reviewed: 04/02/2016 Elsevier Patient Education  2020 [employer].

## 2019-08-03 LAB — BASIC METABOLIC PANEL
Anion gap: 10 (ref 5–15)
BUN: 8 mg/dL (ref 8–23)
CO2: 27 mmol/L (ref 22–32)
Calcium: 9.3 mg/dL (ref 8.9–10.3)
Chloride: 100 mmol/L (ref 98–111)
Creatinine, Ser: 0.85 mg/dL (ref 0.61–1.24)
GFR calc Af Amer: >60 mL/min (ref >60)
GFR calc non Af Amer: >60 mL/min (ref >60)
Glucose, Bld: 114 mg/dL — ABNORMAL HIGH (ref 70–99)
Potassium: 4.2 mmol/L (ref 3.5–5.1)
Sodium: 137 mmol/L (ref 135–145)

## 2019-08-03 LAB — CBC
HCT: 37.4 % — ABNORMAL LOW (ref 39.0–52.0)
Hemoglobin: 12.9 g/dL — ABNORMAL LOW (ref 13.0–17.0)
MCH: 35.1 pg — ABNORMAL HIGH (ref 26.0–34.0)
MCHC: 34.5 g/dL (ref 30.0–36.0)
MCV: 101.6 fL — ABNORMAL HIGH (ref 80.0–100.0)
Platelets: 313 10*3/uL (ref 150–400)
RBC: 3.68 MIL/uL — ABNORMAL LOW (ref 4.22–5.81)
RDW: 14.8 % (ref 11.5–15.5)
WBC: 15.2 10*3/uL — ABNORMAL HIGH (ref 4.0–10.5)
nRBC: 0 % (ref 0.0–0.2)

## 2019-08-03 MED ORDER — POLYETHYLENE GLYCOL 3350 17 G PO PACK
17.0000 g | PACK | Freq: Every day | ORAL | Status: DC
  Administered 2019-08-03 – 2019-08-05 (×3): 17 g via ORAL
  Filled 2019-08-03 (×4): qty 1

## 2019-08-03 NOTE — Progress Notes (Signed)
OT Cancellation Note  Patient Details Name: SKIP LITKE MRN: [id number] DOB: [birth year]   Cancelled Treatment:    Reason Eval/Treat Not Completed: Patient at procedure or test/ unavailable (Pt headed to CT around 1pm. OT to continue to follow.)   OT to try again this PM as schedule permits or next available treatment day.  Jefferey Pica, OTR/L Acute Rehabilitation Services Pager: [phone] Office: [phone]   Eleaner Dibartolo C 08/03/2019, 2:00 PM
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
## 2019-08-04 ENCOUNTER — Inpatient Hospital Stay (HOSPITAL_COMMUNITY): Payer: Medicare Other

## 2019-08-04 MED ORDER — FUROSEMIDE 10 MG/ML IJ SOLN
40.0000 mg | Freq: Once | INTRAMUSCULAR | Status: AC
  Administered 2019-08-04: 40 mg via INTRAVENOUS
  Filled 2019-08-04: qty 4

## 2019-08-04 MED ORDER — MELATONIN 5 MG PO TABS
5.0000 mg | ORAL_TABLET | Freq: Every evening | ORAL | Status: DC | PRN
  Administered 2019-08-05: 5 mg via ORAL
  Filled 2019-08-04: qty 1

## 2019-08-04 MED ORDER — IPRATROPIUM-ALBUTEROL 0.5-2.5 (3) MG/3ML IN SOLN
3.0000 mL | Freq: Four times a day (QID) | RESPIRATORY_TRACT | Status: DC
  Administered 2019-08-04 – 2019-08-05 (×3): 3 mL via RESPIRATORY_TRACT
  Filled 2019-08-04 (×4): qty 3

## 2019-08-04 NOTE — Progress Notes (Signed)
Assisted patient to edge of bed, saturations 92% on room air. While ambulating with PT, saturations dropped to 84%, 2L O2 required to bring patient up to 94%.
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
## 2019-08-04 NOTE — Plan of Care (Signed)
  Problem: Education: Goal: Knowledge of General Education information will improve Description: Including pain rating scale, medication(s)/side effects and non-pharmacologic comfort measures Outcome: Progressing   Problem: Health Behavior/Discharge Planning: Goal: Ability to manage health-related needs will improve Outcome: Progressing   Problem: Clinical Measurements: Goal: Respiratory complications will improve Outcome: Progressing   Problem: Activity: Goal: Risk for activity intolerance will decrease Outcome: Progressing  Patient participating in O2 qualification/O2 walk test. Patient able to ambulate, desaturating to 84% but imrpoving with 2 L O2. Patient denies dyspnea, breath sounds shallow on auscultation.
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
## 2019-08-04 NOTE — Progress Notes (Signed)
SATURATION QUALIFICATIONS: (This note is used to comply with regulatory documentation for home oxygen)  Patient Saturations on Room Air at Rest = 92%  Patient Saturations on Room Air while Ambulating = 84%  Patient Saturations on 2 Liters of oxygen while Ambulating = 94%  Please briefly explain why patient needs home oxygen: To maintain oxygen saturations > 90% while ambulating.    Wyona Almas, PT, DPT Acute Rehabilitation Services Pager [phone] Office [phone]

## 2019-08-05 ENCOUNTER — Inpatient Hospital Stay (HOSPITAL_COMMUNITY): Payer: Medicare Other

## 2019-08-05 MED ORDER — LORAZEPAM 2 MG/ML IJ SOLN
0.5000 mg | Freq: Once | INTRAMUSCULAR | Status: AC
  Administered 2019-08-05: 0.5 mg via INTRAVENOUS
  Filled 2019-08-05: qty 1

## 2019-08-05 MED ORDER — IOHEXOL 300 MG/ML  SOLN
75.0000 mL | Freq: Once | INTRAMUSCULAR | Status: AC | PRN
  Administered 2019-08-05: 75 mL via INTRAVENOUS

## 2019-08-05 MED ORDER — MAGNESIUM CITRATE PO SOLN
0.5000 | Freq: Once | ORAL | Status: AC
  Administered 2019-08-05: 0.5 via ORAL
  Filled 2019-08-05: qty 296

## 2019-08-05 MED ORDER — DM-GUAIFENESIN ER 30-600 MG PO TB12
1.0000 | ORAL_TABLET | Freq: Two times a day (BID) | ORAL | Status: DC
  Administered 2019-08-05 – 2019-08-06 (×2): 1 via ORAL
  Filled 2019-08-05 (×3): qty 1
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
## 2019-08-06 ENCOUNTER — Inpatient Hospital Stay (HOSPITAL_COMMUNITY): Payer: Medicare Other

## 2019-08-06 MED ORDER — DM-GUAIFENESIN ER 30-600 MG PO TB12: 1.0000 | ORAL_TABLET | Freq: Two times a day (BID) | ORAL | Status: AC | PRN

## 2019-08-06 MED ORDER — DOCUSATE SODIUM 100 MG PO CAPS: 100.0000 mg | ORAL_CAPSULE | Freq: Every day | ORAL | Status: AC

## 2019-08-06 MED ORDER — POLYETHYLENE GLYCOL 3350 17 G PO PACK: 17.0000 g | PACK | Freq: Every day | ORAL | Status: AC | PRN

## 2019-08-06 MED ORDER — OXYCODONE HCL 5 MG PO TABS: 5.0000 mg | ORAL_TABLET | Freq: Four times a day (QID) | ORAL | 0 refills | Status: AC | PRN

## 2019-08-06 MED ORDER — METHOCARBAMOL 500 MG PO TABS: 500.0000 mg | ORAL_TABLET | Freq: Three times a day (TID) | ORAL | 0 refills | Status: AC | PRN

## 2019-08-06 MED ORDER — IPRATROPIUM-ALBUTEROL 0.5-2.5 (3) MG/3ML IN SOLN: 3.0000 mL | RESPIRATORY_TRACT | Status: DC | PRN
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
## 2019-08-06 NOTE — Progress Notes (Signed)
D/C summary given to pt.  Pt verbalized understanding to all d/c instructions.  IV site d/c with no issues.  Pt is stable and in no distress.  Pt transported to main entrance via w/c   

## 2019-08-06 NOTE — Progress Notes (Signed)
Noted bloody streaks phlegm coughed out by pt. Will monitor pt.

## 2019-08-10 DIAGNOSIS — Z299 Encounter for prophylactic measures, unspecified: Secondary | ICD-10-CM | POA: Diagnosis not present

## 2019-08-10 DIAGNOSIS — S22009A Unspecified fracture of unspecified thoracic vertebra, initial encounter for closed fracture: Secondary | ICD-10-CM | POA: Diagnosis not present

## 2019-08-10 DIAGNOSIS — R0789 Other chest pain: Secondary | ICD-10-CM | POA: Diagnosis not present

## 2019-08-10 DIAGNOSIS — I700 Atherosclerosis of aorta: Secondary | ICD-10-CM | POA: Diagnosis not present

## 2019-08-31 DIAGNOSIS — S22068A Other fracture of T7-T8 thoracic vertebra, initial encounter for closed fracture: Secondary | ICD-10-CM | POA: Diagnosis not present

## 2019-09-04 DIAGNOSIS — H2513 Age-related nuclear cataract, bilateral: Secondary | ICD-10-CM | POA: Diagnosis not present

## 2019-09-04 DIAGNOSIS — H40033 Anatomical narrow angle, bilateral: Secondary | ICD-10-CM | POA: Diagnosis not present

## 2019-09-10 DIAGNOSIS — D692 Other nonthrombocytopenic purpura: Secondary | ICD-10-CM | POA: Diagnosis not present

## 2019-09-10 DIAGNOSIS — I10 Essential (primary) hypertension: Secondary | ICD-10-CM | POA: Diagnosis not present

## 2019-09-10 DIAGNOSIS — R42 Dizziness and giddiness: Secondary | ICD-10-CM | POA: Diagnosis not present

## 2019-09-10 DIAGNOSIS — I700 Atherosclerosis of aorta: Secondary | ICD-10-CM | POA: Diagnosis not present

## 2019-09-10 DIAGNOSIS — Z299 Encounter for prophylactic measures, unspecified: Secondary | ICD-10-CM | POA: Diagnosis not present

## 2019-10-05 DIAGNOSIS — I700 Atherosclerosis of aorta: Secondary | ICD-10-CM | POA: Diagnosis not present

## 2019-10-05 DIAGNOSIS — R42 Dizziness and giddiness: Secondary | ICD-10-CM | POA: Diagnosis not present

## 2019-10-05 DIAGNOSIS — I10 Essential (primary) hypertension: Secondary | ICD-10-CM | POA: Diagnosis not present

## 2019-10-05 DIAGNOSIS — Z299 Encounter for prophylactic measures, unspecified: Secondary | ICD-10-CM | POA: Diagnosis not present

## 2019-10-19 ENCOUNTER — Other Ambulatory Visit: Payer: Self-pay | Admitting: Student

## 2019-10-19 DIAGNOSIS — S22068A Other fracture of T7-T8 thoracic vertebra, initial encounter for closed fracture: Secondary | ICD-10-CM | POA: Diagnosis not present

## 2019-11-04 ENCOUNTER — Other Ambulatory Visit: Payer: PRIVATE HEALTH INSURANCE

## 2020-02-07 DIAGNOSIS — Z20822 Contact with and (suspected) exposure to covid-19: Secondary | ICD-10-CM | POA: Diagnosis not present

## 2020-02-07 DIAGNOSIS — U071 COVID-19: Secondary | ICD-10-CM | POA: Diagnosis not present

## 2020-02-14 DIAGNOSIS — U071 COVID-19: Secondary | ICD-10-CM | POA: Diagnosis not present

## 2020-02-14 DIAGNOSIS — Z299 Encounter for prophylactic measures, unspecified: Secondary | ICD-10-CM | POA: Diagnosis not present

## 2020-04-19 DIAGNOSIS — R5383 Other fatigue: Secondary | ICD-10-CM | POA: Diagnosis not present

## 2020-04-19 DIAGNOSIS — I10 Essential (primary) hypertension: Secondary | ICD-10-CM | POA: Diagnosis not present

## 2020-04-19 DIAGNOSIS — Z7189 Other specified counseling: Secondary | ICD-10-CM | POA: Diagnosis not present

## 2020-04-19 DIAGNOSIS — Z0000 Encounter for general adult medical examination without abnormal findings: Secondary | ICD-10-CM | POA: Diagnosis not present

## 2020-04-19 DIAGNOSIS — E039 Hypothyroidism, unspecified: Secondary | ICD-10-CM | POA: Diagnosis not present

## 2020-04-19 DIAGNOSIS — Z299 Encounter for prophylactic measures, unspecified: Secondary | ICD-10-CM | POA: Diagnosis not present

## 2020-04-19 DIAGNOSIS — E7800 Pure hypercholesterolemia, unspecified: Secondary | ICD-10-CM | POA: Diagnosis not present

## 2020-04-19 DIAGNOSIS — Z79899 Other long term (current) drug therapy: Secondary | ICD-10-CM | POA: Diagnosis not present

## 2020-05-12 DIAGNOSIS — D482 Neoplasm of uncertain behavior of peripheral nerves and autonomic nervous system: Secondary | ICD-10-CM | POA: Diagnosis not present

## 2020-05-12 DIAGNOSIS — I10 Essential (primary) hypertension: Secondary | ICD-10-CM | POA: Diagnosis not present

## 2020-05-12 DIAGNOSIS — D485 Neoplasm of uncertain behavior of skin: Secondary | ICD-10-CM | POA: Diagnosis not present

## 2020-05-12 DIAGNOSIS — L820 Inflamed seborrheic keratosis: Secondary | ICD-10-CM | POA: Diagnosis not present

## 2020-05-12 DIAGNOSIS — Z299 Encounter for prophylactic measures, unspecified: Secondary | ICD-10-CM | POA: Diagnosis not present

## 2020-07-21 DIAGNOSIS — I10 Essential (primary) hypertension: Secondary | ICD-10-CM | POA: Diagnosis not present

## 2020-07-21 DIAGNOSIS — Z299 Encounter for prophylactic measures, unspecified: Secondary | ICD-10-CM | POA: Diagnosis not present

## 2020-07-21 DIAGNOSIS — I700 Atherosclerosis of aorta: Secondary | ICD-10-CM | POA: Diagnosis not present

## 2020-07-21 DIAGNOSIS — M7989 Other specified soft tissue disorders: Secondary | ICD-10-CM | POA: Diagnosis not present

## 2020-07-21 DIAGNOSIS — M1711 Unilateral primary osteoarthritis, right knee: Secondary | ICD-10-CM | POA: Diagnosis not present

## 2020-07-21 DIAGNOSIS — M79669 Pain in unspecified lower leg: Secondary | ICD-10-CM | POA: Diagnosis not present

## 2020-07-21 DIAGNOSIS — M79661 Pain in right lower leg: Secondary | ICD-10-CM | POA: Diagnosis not present

## 2020-08-07 DIAGNOSIS — S22009A Unspecified fracture of unspecified thoracic vertebra, initial encounter for closed fracture: Secondary | ICD-10-CM | POA: Diagnosis not present

## 2020-08-07 DIAGNOSIS — Z299 Encounter for prophylactic measures, unspecified: Secondary | ICD-10-CM | POA: Diagnosis not present

## 2020-08-07 DIAGNOSIS — I10 Essential (primary) hypertension: Secondary | ICD-10-CM | POA: Diagnosis not present

## 2020-08-15 DIAGNOSIS — M859 Disorder of bone density and structure, unspecified: Secondary | ICD-10-CM | POA: Diagnosis not present

## 2020-08-15 DIAGNOSIS — E248 Other Cushing's syndrome: Secondary | ICD-10-CM | POA: Diagnosis not present
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
## 2020-10-27 DIAGNOSIS — H5213 Myopia, bilateral: Secondary | ICD-10-CM | POA: Diagnosis not present

## 2021-02-08 DIAGNOSIS — Z299 Encounter for prophylactic measures, unspecified: Secondary | ICD-10-CM | POA: Diagnosis not present

## 2021-02-08 DIAGNOSIS — I700 Atherosclerosis of aorta: Secondary | ICD-10-CM | POA: Diagnosis not present

## 2021-02-08 DIAGNOSIS — Z87891 Personal history of nicotine dependence: Secondary | ICD-10-CM | POA: Diagnosis not present

## 2021-02-08 DIAGNOSIS — M179 Osteoarthritis of knee, unspecified: Secondary | ICD-10-CM | POA: Diagnosis not present

## 2021-02-08 DIAGNOSIS — I10 Essential (primary) hypertension: Secondary | ICD-10-CM | POA: Diagnosis not present

## 2021-04-24 DIAGNOSIS — Z7189 Other specified counseling: Secondary | ICD-10-CM | POA: Diagnosis not present

## 2021-04-24 DIAGNOSIS — Z299 Encounter for prophylactic measures, unspecified: Secondary | ICD-10-CM | POA: Diagnosis not present

## 2021-04-24 DIAGNOSIS — I10 Essential (primary) hypertension: Secondary | ICD-10-CM | POA: Diagnosis not present

## 2021-04-24 DIAGNOSIS — Z0000 Encounter for general adult medical examination without abnormal findings: Secondary | ICD-10-CM | POA: Diagnosis not present

## 2021-04-24 DIAGNOSIS — Z79899 Other long term (current) drug therapy: Secondary | ICD-10-CM | POA: Diagnosis not present

## 2021-04-24 DIAGNOSIS — E7800 Pure hypercholesterolemia, unspecified: Secondary | ICD-10-CM | POA: Diagnosis not present

## 2021-04-24 DIAGNOSIS — R5383 Other fatigue: Secondary | ICD-10-CM | POA: Diagnosis not present

## 2021-07-03 DIAGNOSIS — J069 Acute upper respiratory infection, unspecified: Secondary | ICD-10-CM | POA: Diagnosis not present

## 2021-07-03 DIAGNOSIS — Z299 Encounter for prophylactic measures, unspecified: Secondary | ICD-10-CM | POA: Diagnosis not present

## 2021-08-06 DIAGNOSIS — S79929A Unspecified injury of unspecified thigh, initial encounter: Secondary | ICD-10-CM | POA: Diagnosis not present

## 2021-08-06 DIAGNOSIS — I70208 Unspecified atherosclerosis of native arteries of extremities, other extremity: Secondary | ICD-10-CM | POA: Diagnosis not present

## 2021-08-06 DIAGNOSIS — R000 Tachycardia, unspecified: Secondary | ICD-10-CM | POA: Diagnosis not present

## 2021-08-06 DIAGNOSIS — Y999 Unspecified external cause status: Secondary | ICD-10-CM | POA: Diagnosis not present

## 2021-08-06 DIAGNOSIS — M47894 Other spondylosis, thoracic region: Secondary | ICD-10-CM | POA: Diagnosis not present

## 2021-08-06 DIAGNOSIS — S3991XA Unspecified injury of abdomen, initial encounter: Secondary | ICD-10-CM | POA: Diagnosis not present

## 2021-08-06 DIAGNOSIS — M898X8 Other specified disorders of bone, other site: Secondary | ICD-10-CM | POA: Diagnosis not present

## 2021-08-06 DIAGNOSIS — M159 Polyosteoarthritis, unspecified: Secondary | ICD-10-CM | POA: Diagnosis not present

## 2021-08-06 DIAGNOSIS — M61542 Other ossification of muscle, left hand: Secondary | ICD-10-CM | POA: Diagnosis not present

## 2021-08-06 DIAGNOSIS — I499 Cardiac arrhythmia, unspecified: Secondary | ICD-10-CM | POA: Diagnosis not present

## 2021-08-06 DIAGNOSIS — Z041 Encounter for examination and observation following transport accident: Secondary | ICD-10-CM | POA: Diagnosis not present

## 2021-08-06 DIAGNOSIS — R2241 Localized swelling, mass and lump, right lower limb: Secondary | ICD-10-CM | POA: Diagnosis not present

## 2021-08-06 DIAGNOSIS — Y92410 Unspecified street and highway as the place of occurrence of the external cause: Secondary | ICD-10-CM | POA: Diagnosis not present

## 2021-08-06 DIAGNOSIS — R911 Solitary pulmonary nodule: Secondary | ICD-10-CM | POA: Diagnosis not present

## 2021-08-06 DIAGNOSIS — S199XXA Unspecified injury of neck, initial encounter: Secondary | ICD-10-CM | POA: Diagnosis not present

## 2021-08-06 DIAGNOSIS — R609 Edema, unspecified: Secondary | ICD-10-CM | POA: Diagnosis not present

## 2021-08-06 DIAGNOSIS — S0990XA Unspecified injury of head, initial encounter: Secondary | ICD-10-CM | POA: Diagnosis not present

## 2021-08-06 DIAGNOSIS — R0989 Other specified symptoms and signs involving the circulatory and respiratory systems: Secondary | ICD-10-CM | POA: Diagnosis not present

## 2021-08-06 DIAGNOSIS — Z8781 Personal history of (healed) traumatic fracture: Secondary | ICD-10-CM | POA: Diagnosis not present

## 2021-08-06 DIAGNOSIS — S6992XA Unspecified injury of left wrist, hand and finger(s), initial encounter: Secondary | ICD-10-CM | POA: Diagnosis not present

## 2021-08-06 DIAGNOSIS — R2232 Localized swelling, mass and lump, left upper limb: Secondary | ICD-10-CM | POA: Diagnosis not present

## 2021-08-06 DIAGNOSIS — M79604 Pain in right leg: Secondary | ICD-10-CM | POA: Diagnosis not present

## 2021-08-06 DIAGNOSIS — Z743 Need for continuous supervision: Secondary | ICD-10-CM | POA: Diagnosis not present

## 2021-08-06 DIAGNOSIS — M25512 Pain in left shoulder: Secondary | ICD-10-CM | POA: Diagnosis not present

## 2021-08-06 DIAGNOSIS — R6889 Other general symptoms and signs: Secondary | ICD-10-CM | POA: Diagnosis not present

## 2021-08-06 DIAGNOSIS — M11242 Other chondrocalcinosis, left hand: Secondary | ICD-10-CM | POA: Diagnosis not present

## 2021-08-06 DIAGNOSIS — M8589 Other specified disorders of bone density and structure, multiple sites: Secondary | ICD-10-CM | POA: Diagnosis not present

## 2021-08-06 DIAGNOSIS — M4856XA Collapsed vertebra, not elsewhere classified, lumbar region, initial encounter for fracture: Secondary | ICD-10-CM | POA: Diagnosis not present

## 2021-08-06 DIAGNOSIS — M438X4 Other specified deforming dorsopathies, thoracic region: Secondary | ICD-10-CM | POA: Diagnosis not present

## 2021-08-06 DIAGNOSIS — J984 Other disorders of lung: Secondary | ICD-10-CM | POA: Diagnosis not present

## 2021-08-07 DIAGNOSIS — S199XXA Unspecified injury of neck, initial encounter: Secondary | ICD-10-CM | POA: Diagnosis not present

## 2021-08-10 DIAGNOSIS — I10 Essential (primary) hypertension: Secondary | ICD-10-CM | POA: Diagnosis not present

## 2021-08-10 DIAGNOSIS — Z299 Encounter for prophylactic measures, unspecified: Secondary | ICD-10-CM | POA: Diagnosis not present

## 2021-08-10 DIAGNOSIS — M542 Cervicalgia: Secondary | ICD-10-CM | POA: Diagnosis not present

## 2021-08-27 DIAGNOSIS — Z299 Encounter for prophylactic measures, unspecified: Secondary | ICD-10-CM | POA: Diagnosis not present

## 2021-08-27 DIAGNOSIS — E7800 Pure hypercholesterolemia, unspecified: Secondary | ICD-10-CM | POA: Diagnosis not present

## 2021-08-27 DIAGNOSIS — R5383 Other fatigue: Secondary | ICD-10-CM | POA: Diagnosis not present

## 2021-08-27 DIAGNOSIS — E039 Hypothyroidism, unspecified: Secondary | ICD-10-CM | POA: Diagnosis not present

## 2021-08-27 DIAGNOSIS — Z0000 Encounter for general adult medical examination without abnormal findings: Secondary | ICD-10-CM | POA: Diagnosis not present

## 2021-08-27 DIAGNOSIS — I10 Essential (primary) hypertension: Secondary | ICD-10-CM | POA: Diagnosis not present

## 2021-11-19 DIAGNOSIS — J069 Acute upper respiratory infection, unspecified: Secondary | ICD-10-CM | POA: Diagnosis not present

## 2021-11-19 DIAGNOSIS — I10 Essential (primary) hypertension: Secondary | ICD-10-CM | POA: Diagnosis not present

## 2022-01-16 DIAGNOSIS — Z299 Encounter for prophylactic measures, unspecified: Secondary | ICD-10-CM | POA: Diagnosis not present

## 2022-01-16 DIAGNOSIS — I10 Essential (primary) hypertension: Secondary | ICD-10-CM | POA: Diagnosis not present

## 2022-01-16 DIAGNOSIS — M542 Cervicalgia: Secondary | ICD-10-CM | POA: Diagnosis not present

## 2022-05-14 DIAGNOSIS — Z87891 Personal history of nicotine dependence: Secondary | ICD-10-CM | POA: Diagnosis not present

## 2022-05-14 DIAGNOSIS — E039 Hypothyroidism, unspecified: Secondary | ICD-10-CM | POA: Diagnosis not present

## 2022-05-14 DIAGNOSIS — Z7189 Other specified counseling: Secondary | ICD-10-CM | POA: Diagnosis not present

## 2022-05-14 DIAGNOSIS — Z299 Encounter for prophylactic measures, unspecified: Secondary | ICD-10-CM | POA: Diagnosis not present

## 2022-05-14 DIAGNOSIS — I700 Atherosclerosis of aorta: Secondary | ICD-10-CM | POA: Diagnosis not present

## 2022-05-14 DIAGNOSIS — Z0000 Encounter for general adult medical examination without abnormal findings: Secondary | ICD-10-CM | POA: Diagnosis not present

## 2022-05-14 DIAGNOSIS — I10 Essential (primary) hypertension: Secondary | ICD-10-CM | POA: Diagnosis not present

## 2022-08-13 DIAGNOSIS — T675XXA Heat exhaustion, unspecified, initial encounter: Secondary | ICD-10-CM | POA: Diagnosis not present

## 2022-08-13 DIAGNOSIS — Z299 Encounter for prophylactic measures, unspecified: Secondary | ICD-10-CM | POA: Diagnosis not present

## 2022-08-13 DIAGNOSIS — I10 Essential (primary) hypertension: Secondary | ICD-10-CM | POA: Diagnosis not present

## 2022-08-13 DIAGNOSIS — R5383 Other fatigue: Secondary | ICD-10-CM | POA: Diagnosis not present

## 2022-08-13 DIAGNOSIS — H53451 Other localized visual field defect, right eye: Secondary | ICD-10-CM | POA: Diagnosis not present

## 2022-08-13 DIAGNOSIS — Z79899 Other long term (current) drug therapy: Secondary | ICD-10-CM | POA: Diagnosis not present

## 2022-08-26 DIAGNOSIS — H53453 Other localized visual field defect, bilateral: Secondary | ICD-10-CM | POA: Diagnosis not present

## 2022-08-27 DIAGNOSIS — Z0000 Encounter for general adult medical examination without abnormal findings: Secondary | ICD-10-CM | POA: Diagnosis not present

## 2022-08-27 DIAGNOSIS — Z87891 Personal history of nicotine dependence: Secondary | ICD-10-CM | POA: Diagnosis not present

## 2022-08-27 DIAGNOSIS — E039 Hypothyroidism, unspecified: Secondary | ICD-10-CM | POA: Diagnosis not present

## 2022-08-27 DIAGNOSIS — Z79899 Other long term (current) drug therapy: Secondary | ICD-10-CM | POA: Diagnosis not present

## 2022-08-27 DIAGNOSIS — E7800 Pure hypercholesterolemia, unspecified: Secondary | ICD-10-CM | POA: Diagnosis not present

## 2022-08-27 DIAGNOSIS — Z299 Encounter for prophylactic measures, unspecified: Secondary | ICD-10-CM | POA: Diagnosis not present

## 2022-08-27 DIAGNOSIS — R5383 Other fatigue: Secondary | ICD-10-CM | POA: Diagnosis not present

## 2022-08-27 DIAGNOSIS — I10 Essential (primary) hypertension: Secondary | ICD-10-CM | POA: Diagnosis not present

## 2022-09-17 ENCOUNTER — Encounter (INDEPENDENT_AMBULATORY_CARE_PROVIDER_SITE_OTHER): Payer: Medicare Other | Admitting: Ophthalmology

## 2022-09-17 DIAGNOSIS — H353211 Exudative age-related macular degeneration, right eye, with active choroidal neovascularization: Secondary | ICD-10-CM

## 2022-09-17 DIAGNOSIS — H43813 Vitreous degeneration, bilateral: Secondary | ICD-10-CM

## 2022-09-17 DIAGNOSIS — H35033 Hypertensive retinopathy, bilateral: Secondary | ICD-10-CM

## 2022-09-17 DIAGNOSIS — H353122 Nonexudative age-related macular degeneration, left eye, intermediate dry stage: Secondary | ICD-10-CM | POA: Diagnosis not present

## 2022-09-17 DIAGNOSIS — I10 Essential (primary) hypertension: Secondary | ICD-10-CM

## 2022-10-15 ENCOUNTER — Encounter (INDEPENDENT_AMBULATORY_CARE_PROVIDER_SITE_OTHER): Payer: Medicare Other | Admitting: Ophthalmology

## 2022-10-15 DIAGNOSIS — H43813 Vitreous degeneration, bilateral: Secondary | ICD-10-CM

## 2022-10-15 DIAGNOSIS — H353122 Nonexudative age-related macular degeneration, left eye, intermediate dry stage: Secondary | ICD-10-CM | POA: Diagnosis not present

## 2022-10-15 DIAGNOSIS — I10 Essential (primary) hypertension: Secondary | ICD-10-CM

## 2022-10-15 DIAGNOSIS — H35033 Hypertensive retinopathy, bilateral: Secondary | ICD-10-CM

## 2022-10-15 DIAGNOSIS — H353211 Exudative age-related macular degeneration, right eye, with active choroidal neovascularization: Secondary | ICD-10-CM

## 2022-11-12 ENCOUNTER — Encounter (INDEPENDENT_AMBULATORY_CARE_PROVIDER_SITE_OTHER): Payer: Medicare Other | Admitting: Ophthalmology

## 2022-11-12 DIAGNOSIS — H43813 Vitreous degeneration, bilateral: Secondary | ICD-10-CM | POA: Diagnosis not present

## 2022-11-12 DIAGNOSIS — H353211 Exudative age-related macular degeneration, right eye, with active choroidal neovascularization: Secondary | ICD-10-CM

## 2022-11-12 DIAGNOSIS — H35033 Hypertensive retinopathy, bilateral: Secondary | ICD-10-CM

## 2022-11-12 DIAGNOSIS — I10 Essential (primary) hypertension: Secondary | ICD-10-CM | POA: Diagnosis not present

## 2022-11-12 DIAGNOSIS — H353122 Nonexudative age-related macular degeneration, left eye, intermediate dry stage: Secondary | ICD-10-CM

## 2022-12-10 ENCOUNTER — Encounter (INDEPENDENT_AMBULATORY_CARE_PROVIDER_SITE_OTHER): Payer: Medicare Other | Admitting: Ophthalmology

## 2022-12-10 DIAGNOSIS — H353122 Nonexudative age-related macular degeneration, left eye, intermediate dry stage: Secondary | ICD-10-CM

## 2022-12-10 DIAGNOSIS — I10 Essential (primary) hypertension: Secondary | ICD-10-CM | POA: Diagnosis not present

## 2022-12-10 DIAGNOSIS — H353211 Exudative age-related macular degeneration, right eye, with active choroidal neovascularization: Secondary | ICD-10-CM | POA: Diagnosis not present

## 2022-12-10 DIAGNOSIS — H43813 Vitreous degeneration, bilateral: Secondary | ICD-10-CM

## 2022-12-10 DIAGNOSIS — H35033 Hypertensive retinopathy, bilateral: Secondary | ICD-10-CM

## 2022-12-30 DIAGNOSIS — I10 Essential (primary) hypertension: Secondary | ICD-10-CM | POA: Diagnosis not present

## 2022-12-30 DIAGNOSIS — W228XXA Striking against or struck by other objects, initial encounter: Secondary | ICD-10-CM | POA: Diagnosis not present

## 2022-12-30 DIAGNOSIS — S0990XA Unspecified injury of head, initial encounter: Secondary | ICD-10-CM | POA: Diagnosis not present

## 2022-12-30 DIAGNOSIS — E079 Disorder of thyroid, unspecified: Secondary | ICD-10-CM | POA: Diagnosis not present

## 2022-12-30 DIAGNOSIS — Z23 Encounter for immunization: Secondary | ICD-10-CM | POA: Diagnosis not present

## 2022-12-30 DIAGNOSIS — Z79899 Other long term (current) drug therapy: Secondary | ICD-10-CM | POA: Diagnosis not present

## 2022-12-30 DIAGNOSIS — S0101XA Laceration without foreign body of scalp, initial encounter: Secondary | ICD-10-CM | POA: Diagnosis not present

## 2023-01-16 ENCOUNTER — Encounter (INDEPENDENT_AMBULATORY_CARE_PROVIDER_SITE_OTHER): Payer: Medicare Other | Admitting: Ophthalmology

## 2023-01-16 DIAGNOSIS — H43813 Vitreous degeneration, bilateral: Secondary | ICD-10-CM

## 2023-01-16 DIAGNOSIS — H353211 Exudative age-related macular degeneration, right eye, with active choroidal neovascularization: Secondary | ICD-10-CM | POA: Diagnosis not present

## 2023-01-16 DIAGNOSIS — H35033 Hypertensive retinopathy, bilateral: Secondary | ICD-10-CM | POA: Diagnosis not present

## 2023-01-16 DIAGNOSIS — I10 Essential (primary) hypertension: Secondary | ICD-10-CM

## 2023-01-16 DIAGNOSIS — H2513 Age-related nuclear cataract, bilateral: Secondary | ICD-10-CM | POA: Diagnosis not present

## 2023-01-16 DIAGNOSIS — H353122 Nonexudative age-related macular degeneration, left eye, intermediate dry stage: Secondary | ICD-10-CM | POA: Diagnosis not present

## 2023-02-19 ENCOUNTER — Encounter (INDEPENDENT_AMBULATORY_CARE_PROVIDER_SITE_OTHER): Payer: Medicare Other | Admitting: Ophthalmology

## 2023-02-21 ENCOUNTER — Encounter (INDEPENDENT_AMBULATORY_CARE_PROVIDER_SITE_OTHER): Payer: Medicare Other | Admitting: Ophthalmology

## 2023-02-21 DIAGNOSIS — H2513 Age-related nuclear cataract, bilateral: Secondary | ICD-10-CM

## 2023-02-21 DIAGNOSIS — H35033 Hypertensive retinopathy, bilateral: Secondary | ICD-10-CM | POA: Diagnosis not present

## 2023-02-21 DIAGNOSIS — H43813 Vitreous degeneration, bilateral: Secondary | ICD-10-CM | POA: Diagnosis not present

## 2023-02-21 DIAGNOSIS — I10 Essential (primary) hypertension: Secondary | ICD-10-CM

## 2023-02-21 DIAGNOSIS — H353122 Nonexudative age-related macular degeneration, left eye, intermediate dry stage: Secondary | ICD-10-CM

## 2023-02-21 DIAGNOSIS — H353211 Exudative age-related macular degeneration, right eye, with active choroidal neovascularization: Secondary | ICD-10-CM

## 2023-03-28 ENCOUNTER — Encounter (INDEPENDENT_AMBULATORY_CARE_PROVIDER_SITE_OTHER): Payer: Medicare Other | Admitting: Ophthalmology

## 2023-03-28 DIAGNOSIS — H2513 Age-related nuclear cataract, bilateral: Secondary | ICD-10-CM | POA: Diagnosis not present

## 2023-03-28 DIAGNOSIS — H43813 Vitreous degeneration, bilateral: Secondary | ICD-10-CM | POA: Diagnosis not present

## 2023-03-28 DIAGNOSIS — H35033 Hypertensive retinopathy, bilateral: Secondary | ICD-10-CM | POA: Diagnosis not present

## 2023-03-28 DIAGNOSIS — H353231 Exudative age-related macular degeneration, bilateral, with active choroidal neovascularization: Secondary | ICD-10-CM | POA: Diagnosis not present

## 2023-03-28 DIAGNOSIS — I10 Essential (primary) hypertension: Secondary | ICD-10-CM

## 2023-04-25 ENCOUNTER — Encounter (INDEPENDENT_AMBULATORY_CARE_PROVIDER_SITE_OTHER): Admitting: Ophthalmology

## 2023-05-06 ENCOUNTER — Encounter (INDEPENDENT_AMBULATORY_CARE_PROVIDER_SITE_OTHER): Admitting: Ophthalmology

## 2023-05-06 DIAGNOSIS — I10 Essential (primary) hypertension: Secondary | ICD-10-CM

## 2023-05-06 DIAGNOSIS — H353231 Exudative age-related macular degeneration, bilateral, with active choroidal neovascularization: Secondary | ICD-10-CM | POA: Diagnosis not present

## 2023-05-06 DIAGNOSIS — H35033 Hypertensive retinopathy, bilateral: Secondary | ICD-10-CM | POA: Diagnosis not present

## 2023-05-06 DIAGNOSIS — H43813 Vitreous degeneration, bilateral: Secondary | ICD-10-CM | POA: Diagnosis not present

## 2023-06-03 ENCOUNTER — Encounter (INDEPENDENT_AMBULATORY_CARE_PROVIDER_SITE_OTHER): Admitting: Ophthalmology

## 2023-06-03 DIAGNOSIS — H35033 Hypertensive retinopathy, bilateral: Secondary | ICD-10-CM | POA: Diagnosis not present

## 2023-06-03 DIAGNOSIS — I10 Essential (primary) hypertension: Secondary | ICD-10-CM | POA: Diagnosis not present

## 2023-06-03 DIAGNOSIS — H353231 Exudative age-related macular degeneration, bilateral, with active choroidal neovascularization: Secondary | ICD-10-CM

## 2023-06-03 DIAGNOSIS — H43813 Vitreous degeneration, bilateral: Secondary | ICD-10-CM

## 2023-06-13 DIAGNOSIS — R0981 Nasal congestion: Secondary | ICD-10-CM | POA: Diagnosis not present

## 2023-06-13 DIAGNOSIS — Z299 Encounter for prophylactic measures, unspecified: Secondary | ICD-10-CM | POA: Diagnosis not present

## 2023-06-13 DIAGNOSIS — J069 Acute upper respiratory infection, unspecified: Secondary | ICD-10-CM | POA: Diagnosis not present

## 2023-07-01 ENCOUNTER — Encounter (INDEPENDENT_AMBULATORY_CARE_PROVIDER_SITE_OTHER): Admitting: Ophthalmology

## 2023-07-01 DIAGNOSIS — H43813 Vitreous degeneration, bilateral: Secondary | ICD-10-CM

## 2023-07-01 DIAGNOSIS — H35033 Hypertensive retinopathy, bilateral: Secondary | ICD-10-CM

## 2023-07-01 DIAGNOSIS — H353231 Exudative age-related macular degeneration, bilateral, with active choroidal neovascularization: Secondary | ICD-10-CM | POA: Diagnosis not present

## 2023-07-01 DIAGNOSIS — H2513 Age-related nuclear cataract, bilateral: Secondary | ICD-10-CM

## 2023-07-01 DIAGNOSIS — I10 Essential (primary) hypertension: Secondary | ICD-10-CM | POA: Diagnosis not present

## 2023-07-29 ENCOUNTER — Encounter (INDEPENDENT_AMBULATORY_CARE_PROVIDER_SITE_OTHER): Admitting: Ophthalmology

## 2023-08-05 ENCOUNTER — Encounter (INDEPENDENT_AMBULATORY_CARE_PROVIDER_SITE_OTHER): Admitting: Ophthalmology

## 2023-08-05 DIAGNOSIS — I10 Essential (primary) hypertension: Secondary | ICD-10-CM

## 2023-08-05 DIAGNOSIS — H43813 Vitreous degeneration, bilateral: Secondary | ICD-10-CM | POA: Diagnosis not present

## 2023-08-05 DIAGNOSIS — H35033 Hypertensive retinopathy, bilateral: Secondary | ICD-10-CM

## 2023-08-05 DIAGNOSIS — H2513 Age-related nuclear cataract, bilateral: Secondary | ICD-10-CM

## 2023-08-05 DIAGNOSIS — H353231 Exudative age-related macular degeneration, bilateral, with active choroidal neovascularization: Secondary | ICD-10-CM

## 2023-09-05 DIAGNOSIS — Z79899 Other long term (current) drug therapy: Secondary | ICD-10-CM | POA: Diagnosis not present

## 2023-09-05 DIAGNOSIS — Z7189 Other specified counseling: Secondary | ICD-10-CM | POA: Diagnosis not present

## 2023-09-05 DIAGNOSIS — Z1389 Encounter for screening for other disorder: Secondary | ICD-10-CM | POA: Diagnosis not present

## 2023-09-05 DIAGNOSIS — Z299 Encounter for prophylactic measures, unspecified: Secondary | ICD-10-CM | POA: Diagnosis not present

## 2023-09-05 DIAGNOSIS — E7800 Pure hypercholesterolemia, unspecified: Secondary | ICD-10-CM | POA: Diagnosis not present

## 2023-09-05 DIAGNOSIS — I10 Essential (primary) hypertension: Secondary | ICD-10-CM | POA: Diagnosis not present

## 2023-09-05 DIAGNOSIS — Z0000 Encounter for general adult medical examination without abnormal findings: Secondary | ICD-10-CM | POA: Diagnosis not present

## 2023-09-05 DIAGNOSIS — R5383 Other fatigue: Secondary | ICD-10-CM | POA: Diagnosis not present

## 2023-09-09 ENCOUNTER — Encounter (INDEPENDENT_AMBULATORY_CARE_PROVIDER_SITE_OTHER): Admitting: Ophthalmology

## 2023-09-09 DIAGNOSIS — H2513 Age-related nuclear cataract, bilateral: Secondary | ICD-10-CM | POA: Diagnosis not present

## 2023-09-09 DIAGNOSIS — H353231 Exudative age-related macular degeneration, bilateral, with active choroidal neovascularization: Secondary | ICD-10-CM | POA: Diagnosis not present

## 2023-09-09 DIAGNOSIS — H43813 Vitreous degeneration, bilateral: Secondary | ICD-10-CM

## 2023-09-09 DIAGNOSIS — I10 Essential (primary) hypertension: Secondary | ICD-10-CM | POA: Diagnosis not present

## 2023-09-09 DIAGNOSIS — H35033 Hypertensive retinopathy, bilateral: Secondary | ICD-10-CM

## 2023-10-10 DIAGNOSIS — I10 Essential (primary) hypertension: Secondary | ICD-10-CM | POA: Diagnosis not present

## 2023-10-10 DIAGNOSIS — Z299 Encounter for prophylactic measures, unspecified: Secondary | ICD-10-CM | POA: Diagnosis not present

## 2023-10-10 DIAGNOSIS — L03213 Periorbital cellulitis: Secondary | ICD-10-CM | POA: Diagnosis not present

## 2023-10-14 ENCOUNTER — Encounter (INDEPENDENT_AMBULATORY_CARE_PROVIDER_SITE_OTHER): Admitting: Ophthalmology

## 2023-10-23 ENCOUNTER — Encounter (INDEPENDENT_AMBULATORY_CARE_PROVIDER_SITE_OTHER): Admitting: Ophthalmology

## 2023-10-23 DIAGNOSIS — H353231 Exudative age-related macular degeneration, bilateral, with active choroidal neovascularization: Secondary | ICD-10-CM

## 2023-10-23 DIAGNOSIS — H35033 Hypertensive retinopathy, bilateral: Secondary | ICD-10-CM | POA: Diagnosis not present

## 2023-10-23 DIAGNOSIS — H43813 Vitreous degeneration, bilateral: Secondary | ICD-10-CM | POA: Diagnosis not present

## 2023-10-23 DIAGNOSIS — I10 Essential (primary) hypertension: Secondary | ICD-10-CM

## 2023-10-23 DIAGNOSIS — H2513 Age-related nuclear cataract, bilateral: Secondary | ICD-10-CM

## 2023-11-27 ENCOUNTER — Encounter (INDEPENDENT_AMBULATORY_CARE_PROVIDER_SITE_OTHER): Admitting: Ophthalmology

## 2023-11-27 DIAGNOSIS — H353231 Exudative age-related macular degeneration, bilateral, with active choroidal neovascularization: Secondary | ICD-10-CM

## 2023-11-27 DIAGNOSIS — H35033 Hypertensive retinopathy, bilateral: Secondary | ICD-10-CM | POA: Diagnosis not present

## 2023-11-27 DIAGNOSIS — I10 Essential (primary) hypertension: Secondary | ICD-10-CM | POA: Diagnosis not present

## 2023-11-27 DIAGNOSIS — H43813 Vitreous degeneration, bilateral: Secondary | ICD-10-CM

## 2024-01-01 ENCOUNTER — Encounter (INDEPENDENT_AMBULATORY_CARE_PROVIDER_SITE_OTHER): Admitting: Ophthalmology

## 2024-01-01 DIAGNOSIS — H43813 Vitreous degeneration, bilateral: Secondary | ICD-10-CM

## 2024-01-01 DIAGNOSIS — I10 Essential (primary) hypertension: Secondary | ICD-10-CM | POA: Diagnosis not present

## 2024-01-01 DIAGNOSIS — H353231 Exudative age-related macular degeneration, bilateral, with active choroidal neovascularization: Secondary | ICD-10-CM

## 2024-01-01 DIAGNOSIS — H35033 Hypertensive retinopathy, bilateral: Secondary | ICD-10-CM | POA: Diagnosis not present

## 2024-02-02 ENCOUNTER — Encounter (INDEPENDENT_AMBULATORY_CARE_PROVIDER_SITE_OTHER): Admitting: Ophthalmology

## 2024-02-03 ENCOUNTER — Encounter (INDEPENDENT_AMBULATORY_CARE_PROVIDER_SITE_OTHER): Admitting: Ophthalmology

## 2024-02-03 DIAGNOSIS — H353231 Exudative age-related macular degeneration, bilateral, with active choroidal neovascularization: Secondary | ICD-10-CM

## 2024-02-03 DIAGNOSIS — H35033 Hypertensive retinopathy, bilateral: Secondary | ICD-10-CM

## 2024-02-03 DIAGNOSIS — I10 Essential (primary) hypertension: Secondary | ICD-10-CM | POA: Diagnosis not present

## 2024-02-03 DIAGNOSIS — H43813 Vitreous degeneration, bilateral: Secondary | ICD-10-CM

## 2024-03-09 ENCOUNTER — Encounter (INDEPENDENT_AMBULATORY_CARE_PROVIDER_SITE_OTHER): Admitting: Ophthalmology
